# Patient Record
Sex: Male | Born: 1960 | Race: White | Hispanic: No | Marital: Married | Smoking: Never smoker
Health system: Southern US, Community
[De-identification: ages and names within clinical notes are randomized; demographics above are authoritative.]

## PROBLEM LIST (undated history)

## (undated) DIAGNOSIS — K5792 Diverticulitis of intestine, part unspecified, without perforation or abscess without bleeding: Secondary | ICD-10-CM

## (undated) HISTORY — PX: TONSILLECTOMY: SUR1361

---

## 2006-04-22 ENCOUNTER — Inpatient Hospital Stay (HOSPITAL_COMMUNITY): Admission: EM | Admit: 2006-04-22 | Discharge: 2006-04-29 | Payer: Self-pay | Admitting: Emergency Medicine

## 2006-05-05 ENCOUNTER — Encounter: Admission: RE | Admit: 2006-05-05 | Discharge: 2006-05-05 | Payer: Self-pay | Admitting: Interventional Radiology

## 2007-05-24 ENCOUNTER — Emergency Department (HOSPITAL_COMMUNITY): Admission: EM | Admit: 2007-05-24 | Discharge: 2007-05-24 | Payer: Self-pay | Admitting: Emergency Medicine

## 2008-07-07 ENCOUNTER — Emergency Department (HOSPITAL_COMMUNITY): Admission: EM | Admit: 2008-07-07 | Discharge: 2008-07-07 | Payer: Self-pay | Admitting: Emergency Medicine

## 2010-03-08 ENCOUNTER — Encounter: Payer: Self-pay | Admitting: Surgery

## 2010-07-03 NOTE — H&P (Signed)
NAME:  Connor Sanders, Connor Sanders NO.:  [id number]   MEDICAL RECORD NO.:  [id number]          PATIENT TYPE:  INP   LOCATION:  5702                         FACILITY:  [facility]   PHYSICIAN:  Cherylynn Ridges, M.D.    DATE OF BIRTH:  1960/06/16   DATE OF ADMISSION:  04/22/2006  DATE OF DISCHARGE:                              HISTORY & PHYSICAL   CHIEF COMPLAINT:  The patient is a 50 year old with acute diverticulitis  and diverticular abscess who comes in for admission and possible  surgery.   HISTORY OF PRESENT ILLNESS:  The patient has been sick since Saturday a  week ago.  He started out with some lower abdominal pain.  This subsided  by the following Tuesday to where he had completely no pain.  He did not  seek medical attention at that time.  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  His pain was worse and they sent home for  it because he looked very sick.   He came to emergency room about 3 o'clock this afternoon and had a CT  scan done subsequently which demonstrated acute diverticulitis with some  focal areas of free air, some free fluid, small bowel loops that are  dilated and a peridiverticular abscess.  Surgical consultation was  obtained.   His past medical history is unremarkable.  He has no renal, pulmonary or  other disease.  No liver disease.  He has no hypertension, no cardiac  disease.  He is a nonsmoker, nondrinker.  He has no primary MD.   REVIEW OF SYSTEMS:  He has had some normal bowel movements but none in  the last 24 hours.  His appetite has been good.  Last ate before coming  into the emergency room.   EXAMINATION:  He initially had a fever of 102.6.  He is 98.3 currently  and feels much better.  He has received 2 mg of IV morphine.  When he  came in, his pain was an 8 out of 10, that was about 7 p.m.  Currently,  at about 10:30 p.m., his pain is 0 out of  10 and he has not received any  morphine for 2-3 hours.  He is normocephalic, atraumatic and anicteric.  His neck is supple.  He has no cervical adenopathy.  He has no carotid  bruits.  His lungs are clear to auscultation.  His cardiac exam regular  rhythm and rate with no murmurs, gallops, lifts or heaves.  His abdomen  is distended.  He has a quiet abdomen.  No diffuse peritonitis.  He has  calf tenderness in the left side and the left lower quadrant but none on  the right side.  He is soft.  I cannot palpate any masses but he is  tender to palpation on the left side.  Rectal exam was not performed.  Cranial nerves II-XII are grossly intact.  Deep tendon reflexes are  symmetrical bilaterally.  Pulses got good femoral and dorsalis pedis and  pedal  pulses bilaterally.   LABORATORY STUDIES:  He has a white blood cell count of 12.3 thousand.  His hemoglobin was 13.5, hematocrit of 38.7.  Platelet count of 280.  UA  is unremarkable.  Electrolytes:  He has a potassium of 3.3, BUN 13,  creatinine is 1.0, albumin 3.3.  His SGOT and SGPT all appear to be  within normal limits.  Lipase is normal.  I have reviewed the patient's  CAT scan which does show some fluid diffusely in the abdomen and a few  bubbles in the right upper quadrant and around the peridiverticular  abscess which is very prominent on the CT scan.  I have asked the doctor  in radiology to see if this abscess were amenable to percutaneous  drainage and they would get back with me about doing so.  Otherwise  right now, he seems to be perfectly stable and comfortable.  I cannot  feed him, would not attempt to feed him, make him n.p.o., start him on  IV Primaxin and work to possibly get a percutaneous drain done tomorrow.  If this does not work out, I have explained to both he and his wife that  if he requires emergency  surgery then he will get a colectomy and colostomy with possible  takedown in the future.  The patient and his  wife understand this, along  with the friend who is in the room, and I will go ahead and admit the  patient to the [place] surgery service.      Cherylynn Ridges, M.D.  Electronically Signed     JOW/MEDQ  D:  04/22/2006  T:  04/23/2006  Job:  [id number]

## 2010-07-03 NOTE — Discharge Summary (Signed)
NAME:  Connor Sanders, Connor Sanders            ACCOUNT NO.:  [id number]   MEDICAL RECORD NO.:  [id number]          PATIENT TYPE:  INP   LOCATION:  5715                         FACILITY:  [facility]   PHYSICIAN:  Thornton Park. Daphine Deutscher, MD  DATE OF BIRTH:  1960-07-01   DATE OF ADMISSION:  04/22/2006  DATE OF DISCHARGE:  04/30/2006                               DISCHARGE SUMMARY   CHIEF COMPLAINT AND REASON FOR ADMISSION:  [name] is a 50 year old  male patient with no prior history of diverticular disease and no other  chronic high medical problems.  The patient had been having of abdominal  symptoms for 1 week.  Initially started as lower abdominal pain, waxed  and waned but became more severe by the date of admission.  He presented  to the ER in the late afternoon of April 22, 2006.  A CT scan revealed  acute diverticulitis with focal areas of free air, some free fluid and  small bowel loops that are dilated with a peridiverticular abscess.  Surgical consultation was obtained with Dr. Lindie Spruce.  On initial exam the  patient was febrile with a temperature of 102.6.  Pain  was better after  receiving morphine.  His abdomen had no bowel sounds.  He was tender  without evidence of peritonitis.  His white cell count was 12,300,  hemoglobin 13.5, platelet count 280.000, potassium slightly low at 3.3  and lipase was normal.  Dr. Lindie Spruce did review the patient's CT scan and  felt that the patient did have a peridiverticular abscess and has asked  interventional radiology to evaluate for percutaneous drainage.  Subsequently the patient has been admitted with the diagnosis of acute  diverticulitis with diverticular abscess.   HOSPITAL COURSE:  The patient was admitted to the general floor where he  was started on n.p.o. status, IV fluid hydration and IV Primaxin for  empiric treatment of diverticular abscess.  He was evaluated by  interventional radiology on April 23, 2006  and underwent a percutaneous  CT guided  drainage of diverticular abscess.  Purulent material was  obtained and was sent for culture.  This culture subsequently returned  back positive for pansensitive at E-coli.   The patient's pain was initially treated with IV narcotics and his bowel  sounds returned.  Abdomen softened up and became less tender.  Therefore, by April 24, 2006, he was started on clear liquids.  Diet was  advanced.  Teaching was given regarding low residue diet.  By April 25, 2006, the patient's white count was 9200.  He was having minimal lower  abdominal pain.  Repeat CT was performed on April 27, 2006.  This  revealed two new fluid collections inferior in the pelvis.  The patient  was subsequently taken on April 28, 2006 for a transgluteal percutaneous  drain per interventional radiology and 10 mL of serous fluid was  obtained and has been sent for culture.  This is pending at the time of  discharge.   By April 28, 2006, the patient had well controlled pain using oral pain  medications.  He had no fevers and was  tolerating a low residue diet  without abdominal pain.  He had minimal drainage from both of the  percutaneous drains and was otherwise deemed appropriate for discharge  home.   FINAL DISCHARGE DIAGNOSIS:  1. Acute diverticulitis.  2. Peridiverticular abscess.  3. Status post percutaneous drainage of pelvic abscesses x2.  First      abscess culture positive for pansensitive E-coli.   DISCHARGE MEDICATIONS:  1. Augmentin XR 875 mg b.i.d.  2. Since Vicodin 5/325 one or two tablets every 4 hours as needed for      pain.  3. Over-the-counter ibuprofen as needed for pain.   DISPOSITION:  Return to work.  This will be determined by Dr. Daphine Deutscher  after he sees him in follow-up.   ACTIVITY:  Increase activity slowly.  May walk up steps.  May shower.  No lifting greater than 15 pounds in the next two weeks.  No driving  while taking Vicodin.   FOLLOW-UP APPOINTMENTS:  The patient has an appointment  to see Dr.  Daphine Deutscher on Thursday April 3  at 12 p.m..  I have asked the hospital staff  to schedule the patient for an outpatient CT scan 1 week from discharge  prior to the April 3 appointment.  This actual appointment is pending  scheduling at time of dictation.   OTHER INSTRUCTIONS:  1. The patient has been instructed to bring his insurance card with      him to the physician visit.  2. He is to call the surgeon's office if a fever greater than to equal      to 101 degrees Fahrenheit, nausea, vomiting or diarrhea, new or      increased belly pain, drainage around the catheters.  3. The patient will be [facility] an [facility] with [facility].  They will manage flushing and other issues, i.e. dressing      changes to the percutaneous drain sites.  April 29, 2006      Revonda Standard L. Gwyneth Sprout Daphine Deutscher, MD  Electronically Signed    ALE/MEDQ  D:  04/29/2006  T:  04/30/2006  Job:  [id number]   cc:   Cherylynn Ridges, M.D.

## 2017-10-11 ENCOUNTER — Inpatient Hospital Stay (HOSPITAL_COMMUNITY)
Admission: EM | Admit: 2017-10-11 | Discharge: 2017-10-12 | DRG: 200 | Disposition: A | Discharge status: Home / Self Care | Payer: BLUE CROSS/BLUE SHIELD | Attending: General Surgery | Admitting: General Surgery

## 2017-10-11 ENCOUNTER — Encounter (HOSPITAL_COMMUNITY): Payer: Self-pay | Admitting: *Deleted

## 2017-10-11 ENCOUNTER — Other Ambulatory Visit: Payer: Self-pay

## 2017-10-11 ENCOUNTER — Emergency Department (HOSPITAL_COMMUNITY): Payer: BLUE CROSS/BLUE SHIELD

## 2017-10-11 DIAGNOSIS — S270XXA Traumatic pneumothorax, initial encounter: Secondary | ICD-10-CM | POA: Diagnosis not present

## 2017-10-11 DIAGNOSIS — S2241XA Multiple fractures of ribs, right side, initial encounter for closed fracture: Secondary | ICD-10-CM

## 2017-10-11 DIAGNOSIS — W132XXA Fall from, out of or through roof, initial encounter: Secondary | ICD-10-CM | POA: Diagnosis present

## 2017-10-11 DIAGNOSIS — S2249XA Multiple fractures of ribs, unspecified side, initial encounter for closed fracture: Secondary | ICD-10-CM | POA: Diagnosis present

## 2017-10-11 DIAGNOSIS — S22009A Unspecified fracture of unspecified thoracic vertebra, initial encounter for closed fracture: Secondary | ICD-10-CM

## 2017-10-11 DIAGNOSIS — S22069A Unspecified fracture of T7-T8 vertebra, initial encounter for closed fracture: Secondary | ICD-10-CM | POA: Diagnosis present

## 2017-10-11 HISTORY — DX: Diverticulitis of intestine, part unspecified, without perforation or abscess without bleeding: K57.92

## 2017-10-11 LAB — BASIC METABOLIC PANEL
Anion gap: 8 (ref 5–15)
BUN: 18 mg/dL (ref 6–20)
CO2: 24 mmol/L (ref 22–32)
Calcium: 9.2 mg/dL (ref 8.9–10.3)
Chloride: 105 mmol/L (ref 98–111)
Creatinine, Ser: 1.09 mg/dL (ref 0.61–1.24)
GFR calc Af Amer: >60 mL/min (ref >60)
GFR calc non Af Amer: >60 mL/min (ref >60)
Glucose, Bld: 130 mg/dL — ABNORMAL HIGH (ref 70–99)
Potassium: 4 mmol/L (ref 3.5–5.1)
Sodium: 137 mmol/L (ref 135–145)

## 2017-10-11 LAB — CBC WITH DIFFERENTIAL/PLATELET
Basophils Absolute: 0 10*3/uL (ref 0.0–0.1)
Basophils Relative: 0 %
Eosinophils Absolute: 0 10*3/uL (ref 0.0–0.7)
Eosinophils Relative: 0 %
HCT: 38.6 % — ABNORMAL LOW (ref 39.0–52.0)
Hemoglobin: 13.5 g/dL (ref 13.0–17.0)
Lymphocytes Relative: 6 %
Lymphs Abs: 1.2 10*3/uL (ref 0.7–4.0)
MCH: 30.3 pg (ref 26.0–34.0)
MCHC: 35 g/dL (ref 30.0–36.0)
MCV: 86.7 fL (ref 78.0–100.0)
Monocytes Absolute: 1.2 10*3/uL — ABNORMAL HIGH (ref 0.1–1.0)
Monocytes Relative: 6 %
Neutro Abs: 17.5 10*3/uL — ABNORMAL HIGH (ref 1.7–7.7)
Neutrophils Relative %: 88 %
Platelets: 224 10*3/uL (ref 150–400)
RBC: 4.45 MIL/uL (ref 4.22–5.81)
RDW: 12.9 % (ref 11.5–15.5)
WBC: 19.9 10*3/uL — ABNORMAL HIGH (ref 4.0–10.5)

## 2017-10-11 MED ORDER — HYDROMORPHONE HCL 1 MG/ML IJ SOLN
0.5000 mg | Freq: Once | INTRAMUSCULAR | Status: AC
  Administered 2017-10-11: 0.5 mg via INTRAVENOUS
  Filled 2017-10-11: qty 1

## 2017-10-11 MED ORDER — LORAZEPAM 2 MG/ML IJ SOLN
1.0000 mg | Freq: Once | INTRAMUSCULAR | Status: AC
  Administered 2017-10-11: 1 mg via INTRAVENOUS
  Filled 2017-10-11: qty 1

## 2017-10-11 MED ORDER — IOHEXOL 300 MG/ML  SOLN
75.0000 mL | Freq: Once | INTRAMUSCULAR | Status: AC | PRN
  Administered 2017-10-11: 75 mL via INTRAVENOUS

## 2017-10-11 MED ORDER — OXYCODONE-ACETAMINOPHEN 5-325 MG PO TABS
2.0000 | ORAL_TABLET | Freq: Once | ORAL | Status: AC
  Administered 2017-10-11: 2 via ORAL
  Filled 2017-10-11: qty 2

## 2017-10-11 MED ORDER — SODIUM CHLORIDE 0.9 % IV SOLN
INTRAVENOUS | Status: DC
  Administered 2017-10-11: 22:00:00 via INTRAVENOUS

## 2017-10-11 MED ORDER — KETOROLAC TROMETHAMINE 30 MG/ML IJ SOLN
15.0000 mg | Freq: Once | INTRAMUSCULAR | Status: AC
  Administered 2017-10-11: 15 mg via INTRAVENOUS
  Filled 2017-10-11: qty 1

## 2017-10-11 NOTE — ED Triage Notes (Signed)
Pt with fall from roof about 7 feet, landed on a deck metal chair on deck to his back. Pt to right upper abdomen, hard to breathe per pt.

## 2017-10-11 NOTE — ED Provider Notes (Signed)
[facility] EMERGENCY DEPARTMENT Provider Note   CSN: [id number] Arrival date & time: 10/11/17  2006     History   Chief Complaint Chief Complaint  Patient presents with  . Fall    HPI Raelene BottRaymond A Daughdrill is a 57 y.o. male.  HPI   57 year old male with right back and chest pain.  Mechanical fall shortly before arrival.  He estimates he fell approximately 7 feet from a roof.  Landed on some patio furniture.  He has had severe pain since then.  Did not strike his head.  No loss of consciousness.  Denies any headache or neck pain.  Pain is significantly worse with movement and breathing.  Denies abdominal pain.  No nausea vomiting.  No blood thinners.  History reviewed. No pertinent past medical history.  There are no active problems to display for this patient.   History reviewed. No pertinent surgical history.      Home Medications    Prior to Admission medications   Not on File    Family History History reviewed. No pertinent family history.  Social History Social History   Tobacco Use  . Smoking status: Never Smoker  . Smokeless tobacco: Never Used  Substance Use Topics  . Alcohol use: Never    Frequency: Never  . Drug use: Never     Allergies   Patient has no known allergies.   Review of Systems Review of Systems  All systems reviewed and negative, other than as noted in HPI.  Physical Exam Updated Vital Signs BP 114/77   Pulse 62   Temp (!) 97.5 F (36.4 C) (Oral)   Resp 14   Ht 5\' 11"  (1.803 m)   Wt 98.4 kg   SpO2 94%   BMI 30.27 kg/m   Physical Exam  Constitutional: He is oriented to person, place, and time. He appears well-developed and well-nourished.  Sitting in chair.  Appears extremely uncomfortable.  HENT:  Head: Normocephalic and atraumatic.  Eyes: Conjunctivae are normal. Right eye exhibits no discharge. Left eye exhibits no discharge.  Neck: Neck supple.  Cardiovascular: Normal rate, regular rhythm and normal heart  sounds. Exam reveals no gallop and no friction rub.  No murmur heard. Pulmonary/Chest: No respiratory distress.  Splinting.  Shallow respirations.  Breath sounds seem symmetric though.  Severe tenderness in the right lateral and posterior chest wall.  Mild swelling of the right mid thoracic back.  Abdominal: Soft. He exhibits no distension. There is no tenderness.  Musculoskeletal: He exhibits no edema or tenderness.  Neurological: He is alert and oriented to person, place, and time. No cranial nerve deficit. He exhibits normal muscle tone. Coordination normal.  Skin: Skin is warm and dry.  Psychiatric: He has a normal mood and affect. His behavior is normal. Thought content normal.  Nursing note and vitals reviewed.    ED Treatments / Results  Labs (all labs ordered are listed, but only abnormal results are displayed) Labs Reviewed  CBC WITH DIFFERENTIAL/PLATELET - Abnormal; Notable for the following components:      Result Value   WBC 19.9 (*)    HCT 38.6 (*)    Neutro Abs 17.5 (*)    Monocytes Absolute 1.2 (*)    All other components within normal limits  BASIC METABOLIC PANEL - Abnormal; Notable for the following components:   Glucose, Bld 130 (*)    All other components within normal limits    EKG None  Radiology Dg Ribs Unilateral W/chest Right  Result  Date: 10/11/2017 CLINICAL DATA:  Shortness of breath and right-sided rib pain following fall from roof, initial encounter EXAM: RIGHT RIBS AND CHEST - 3+ VIEW COMPARISON:  None. FINDINGS: Cardiac shadow is within normal limits. The overall inspiratory effort is poor with right basilar atelectasis. No pneumothorax is seen. Multiple right rib fractures are noted to include the sixth through ninth ribs posterolaterally. Some subcutaneous air is noted. IMPRESSION: Fractures of the right sixth through ninth ribs posterolaterally with associated basilar atelectasis. No definitive pneumothorax is seen. Electronically Signed   By: Alcide Clever M.D.   On: 10/11/2017 21:43   Ct Chest W Contrast  Result Date: 10/11/2017 CLINICAL DATA:  57 y/o M; fall from 7 foot roof. Blunt trauma with injury to the right upper abdomen. EXAM: CT CHEST WITH CONTRAST TECHNIQUE: Multidetector CT imaging of the chest was performed during intravenous contrast administration. CONTRAST:  75mL OMNIPAQUE IOHEXOL 300 MG/ML  SOLN COMPARISON:  None. FINDINGS: Cardiovascular: No significant vascular findings. Normal heart size. No pericardial effusion. Severe coronary artery calcific atherosclerosis. Mediastinum/Nodes: No enlarged mediastinal, hilar, or axillary lymph nodes. Thyroid gland, trachea, and esophagus demonstrate no significant findings. Lungs/Pleura: Edema within the right posterior chest wall as well as pneumatosis for multiple rib fractures. Small right pneumothorax. Upper Abdomen: Cholelithiasis. Musculoskeletal: Mildly displaced right lateral 6 rib fracture. Right rib 7 neck minimally displaced fracture and fracture of the adjacent T7 right transverse process. Additional rib 7 right posterolateral 2 shaft's width displaced and comminuted fracture. Right rib 8 minimally displaced posterolateral fracture. Right rib 9 minimally displaced rib head and posterolateral fractures. IMPRESSION: 1. Small right pneumothorax.  No mediastinal shift. 2. Right ribs 6-9 posterior fractures. Ribs 7 and 9 have multiple fractures and there is 2 shaft's width displacement of the right posterolateral rib 7 fracture. 3. Right T7 transverse process minimally displaced fracture. 4. Edema and pneumatosis within the right posterior chest wall. 5. Cholelithiasis. 6. Severe coronary artery calcific atherosclerosis. These results were called by telephone at the time of interpretation on 10/11/2017 at 11:14 pm to Dr. Raeford Razor , who verbally acknowledged these results. Electronically Signed   By: Mitzi Hansen M.D.   On: 10/11/2017 23:21    Procedures Procedures (including  critical care time)  Medications Ordered in ED Medications  0.9 %  sodium chloride infusion ( Intravenous New Bag/Given 10/11/17 2220)  oxyCODONE-acetaminophen (PERCOCET/ROXICET) 5-325 MG per tablet 2 tablet (2 tablets Oral Given 10/11/17 2032)  ketorolac (TORADOL) 30 MG/ML injection 15 mg (15 mg Intravenous Given 10/11/17 2220)  LORazepam (ATIVAN) injection 1 mg (1 mg Intravenous Given 10/11/17 2220)  HYDROmorphone (DILAUDID) injection 0.5 mg (0.5 mg Intravenous Given 10/11/17 2220)  iohexol (OMNIPAQUE) 300 MG/ML solution 75 mL (75 mLs Intravenous Contrast Given 10/11/17 2256)     Initial Impression / Assessment and Plan / ED Course  I have reviewed the triage vital signs and the nursing notes.  Pertinent labs & imaging results that were available during my care of the patient were reviewed by me and considered in my medical decision making (see chart for details).  I have reviewed the triage vital signs and the nursing notes. Prior records were reviewed for additional information.    Pertinent labs & imaging results that were available during my care of the patient were reviewed by me and considered in my medical decision making (see chart for details).   Mechanical fall.  Multiple rib fractures with 7 and 9 broken in multiple locations.  Right T7 transverse process fracture.  Occult pneumothorax.  Discussed with Dr Magnus Ivan, trauma. Transfer to [facility] ED. Discussed with Dr Judd Lien, EDP.   Final Clinical Impressions(s) / ED Diagnoses   Final diagnoses:  Closed fracture of multiple ribs of right side, initial encounter  Closed fracture of transverse process of thoracic vertebra, initial encounter [facility])  Traumatic pneumothorax, initial encounter    ED Discharge Orders    None       Raeford Razor, MD 10/12/17 [phone]

## 2017-10-12 ENCOUNTER — Inpatient Hospital Stay (HOSPITAL_COMMUNITY): Payer: BLUE CROSS/BLUE SHIELD

## 2017-10-12 ENCOUNTER — Encounter (HOSPITAL_COMMUNITY): Payer: Self-pay | Admitting: General Practice

## 2017-10-12 DIAGNOSIS — S22069A Unspecified fracture of T7-T8 vertebra, initial encounter for closed fracture: Secondary | ICD-10-CM | POA: Diagnosis not present

## 2017-10-12 DIAGNOSIS — S270XXA Traumatic pneumothorax, initial encounter: Secondary | ICD-10-CM | POA: Diagnosis present

## 2017-10-12 DIAGNOSIS — S2249XA Multiple fractures of ribs, unspecified side, initial encounter for closed fracture: Secondary | ICD-10-CM | POA: Diagnosis present

## 2017-10-12 DIAGNOSIS — W132XXA Fall from, out of or through roof, initial encounter: Secondary | ICD-10-CM | POA: Diagnosis not present

## 2017-10-12 DIAGNOSIS — S2241XA Multiple fractures of ribs, right side, initial encounter for closed fracture: Secondary | ICD-10-CM | POA: Diagnosis not present

## 2017-10-12 LAB — BASIC METABOLIC PANEL
Anion gap: 8 (ref 5–15)
BUN: 17 mg/dL (ref 6–20)
CALCIUM: 8.9 mg/dL (ref 8.9–10.3)
CO2: 23 mmol/L (ref 22–32)
Chloride: 105 mmol/L (ref 98–111)
Creatinine, Ser: 0.97 mg/dL (ref 0.61–1.24)
GFR calc Af Amer: >60 mL/min (ref >60)
GLUCOSE: 127 mg/dL — AB (ref 70–99)
Potassium: 4.2 mmol/L (ref 3.5–5.1)
Sodium: 136 mmol/L (ref 135–145)

## 2017-10-12 LAB — CBC
HCT: 37.7 % — ABNORMAL LOW (ref 39.0–52.0)
Hemoglobin: 12.4 g/dL — ABNORMAL LOW (ref 13.0–17.0)
MCH: 30 pg (ref 26.0–34.0)
MCHC: 32.9 g/dL (ref 30.0–36.0)
MCV: 91.1 fL (ref 78.0–100.0)
PLATELETS: 207 10*3/uL (ref 150–400)
RBC: 4.14 MIL/uL — ABNORMAL LOW (ref 4.22–5.81)
RDW: 12.7 % (ref 11.5–15.5)
WBC: 9.4 10*3/uL (ref 4.0–10.5)

## 2017-10-12 LAB — HIV ANTIBODY (ROUTINE TESTING W REFLEX): HIV Screen 4th Generation wRfx: NONREACTIVE

## 2017-10-12 MED ORDER — DOCUSATE SODIUM 100 MG PO CAPS
100.0000 mg | ORAL_CAPSULE | Freq: Every day | ORAL | Status: DC
  Administered 2017-10-12: 100 mg via ORAL
  Filled 2017-10-12: qty 1

## 2017-10-12 MED ORDER — HYDROMORPHONE HCL 1 MG/ML IJ SOLN: 1.0000 mg | INTRAMUSCULAR | Status: DC | PRN

## 2017-10-12 MED ORDER — DIPHENHYDRAMINE HCL 50 MG/ML IJ SOLN: 12.5000 mg | Freq: Four times a day (QID) | INTRAMUSCULAR | Status: DC | PRN

## 2017-10-12 MED ORDER — ACETAMINOPHEN 500 MG PO TABS: 1000.0000 mg | ORAL_TABLET | Freq: Three times a day (TID) | ORAL | 0 refills | Status: AC | PRN

## 2017-10-12 MED ORDER — OXYCODONE HCL 5 MG PO TABS: 10.0000 mg | ORAL_TABLET | ORAL | Status: DC | PRN

## 2017-10-12 MED ORDER — ONDANSETRON HCL 4 MG/2ML IJ SOLN: 4.0000 mg | Freq: Four times a day (QID) | INTRAMUSCULAR | Status: DC | PRN

## 2017-10-12 MED ORDER — NALOXONE HCL 0.4 MG/ML IJ SOLN: 0.4000 mg | INTRAMUSCULAR | Status: DC | PRN

## 2017-10-12 MED ORDER — POTASSIUM CHLORIDE IN NACL 20-0.9 MEQ/L-% IV SOLN
INTRAVENOUS | Status: DC
  Administered 2017-10-12: 06:00:00 via INTRAVENOUS
  Filled 2017-10-12: qty 1000

## 2017-10-12 MED ORDER — ACETAMINOPHEN 500 MG PO TABS: 1000.0000 mg | ORAL_TABLET | Freq: Three times a day (TID) | ORAL | Status: DC

## 2017-10-12 MED ORDER — OXYCODONE HCL 5 MG PO TABS: 5.0000 mg | ORAL_TABLET | ORAL | Status: DC | PRN

## 2017-10-12 MED ORDER — POLYETHYLENE GLYCOL 3350 17 G PO PACK
17.0000 g | PACK | Freq: Every day | ORAL | Status: DC
  Administered 2017-10-12: 17 g via ORAL
  Filled 2017-10-12: qty 1

## 2017-10-12 MED ORDER — DIPHENHYDRAMINE HCL 12.5 MG/5ML PO ELIX: 12.5000 mg | ORAL_SOLUTION | Freq: Four times a day (QID) | ORAL | Status: DC | PRN

## 2017-10-12 MED ORDER — ENOXAPARIN SODIUM 40 MG/0.4ML ~~LOC~~ SOLN
40.0000 mg | SUBCUTANEOUS | Status: DC
  Filled 2017-10-12: qty 0.4

## 2017-10-12 MED ORDER — MORPHINE SULFATE 2 MG/ML IV SOLN
INTRAVENOUS | Status: DC
  Administered 2017-10-12: 08:00:00 via INTRAVENOUS
  Filled 2017-10-12: qty 30

## 2017-10-12 MED ORDER — ACETAMINOPHEN 325 MG PO TABS
650.0000 mg | ORAL_TABLET | Freq: Four times a day (QID) | ORAL | Status: DC
  Administered 2017-10-12: 650 mg via ORAL
  Filled 2017-10-12: qty 2

## 2017-10-12 MED ORDER — TRAMADOL HCL 50 MG PO TABS: 50.0000 mg | ORAL_TABLET | Freq: Four times a day (QID) | ORAL | Status: DC | PRN

## 2017-10-12 MED ORDER — TRAMADOL HCL 50 MG PO TABS
50.0000 mg | ORAL_TABLET | Freq: Four times a day (QID) | ORAL | Status: DC
  Administered 2017-10-12 (×2): 50 mg via ORAL
  Filled 2017-10-12 (×2): qty 1

## 2017-10-12 MED ORDER — ONDANSETRON 4 MG PO TBDP: 4.0000 mg | ORAL_TABLET | Freq: Four times a day (QID) | ORAL | Status: DC | PRN

## 2017-10-12 MED ORDER — IBUPROFEN 800 MG PO TABS: 400.0000 mg | ORAL_TABLET | Freq: Three times a day (TID) | ORAL | 0 refills | Status: AC | PRN

## 2017-10-12 MED ORDER — KETOROLAC TROMETHAMINE 30 MG/ML IJ SOLN
30.0000 mg | Freq: Three times a day (TID) | INTRAMUSCULAR | Status: DC
  Administered 2017-10-12 (×2): 30 mg via INTRAVENOUS
  Filled 2017-10-12 (×2): qty 1

## 2017-10-12 MED ORDER — SODIUM CHLORIDE 0.9% FLUSH: 9.0000 mL | INTRAVENOUS | Status: DC | PRN

## 2017-10-12 MED ORDER — POLYETHYLENE GLYCOL 3350 17 G PO PACK: 17.0000 g | PACK | Freq: Every day | ORAL | 0 refills | Status: AC | PRN

## 2017-10-12 MED ORDER — TRAMADOL HCL 50 MG PO TABS: 50.0000 mg | ORAL_TABLET | Freq: Four times a day (QID) | ORAL | 0 refills | Status: AC | PRN

## 2017-10-12 NOTE — Progress Notes (Signed)
Allen KellNadia Harvey, RN witnessed waste of 21 mg of morphine PCA

## 2017-10-12 NOTE — Evaluation (Signed)
Physical Therapy Evaluation & Discharge Patient Details Name: Connor Sanders MRN: [id number] DOB: 09/21/1960 Today's Date: 10/12/2017   History of Present Illness  Patient is a 57 y/o male status post fall from 7' roof with multiple rib fractures and very small right pneumothorax &  CT scan did show a C7 transverse process fracture. .  Clinical Impression  Patient presents close to functional baseline with mild limitations due to rib pain on L.  Reports L arm was not as mobile yesterday, but noted able to hold antigravity.  Feel should progress at home without follow up PT and safe for d/c to home with family.  No further skilled PT needs at this time.     Follow Up Recommendations No PT follow up;Supervision - Intermittent    Equipment Recommendations  None recommended by PT    Recommendations for Other Services       Precautions / Restrictions Precautions Precautions: None      Mobility  Bed Mobility Overal bed mobility: Modified Independent                Transfers Overall transfer level: Independent Equipment used: None                Ambulation/Gait Ambulation/Gait assistance: Independent Gait Distance (Feet): 250 Feet Assistive device: None Gait Pattern/deviations: Step-through pattern;WFL(Within Functional Limits)     General Gait Details: SpO2 on RA with ambulation 93%  Stairs            Wheelchair Mobility    Modified Rankin (Stroke Patients Only)       Balance Overall balance assessment: No apparent balance deficits (not formally assessed)                                           Pertinent Vitals/Pain Pain Assessment: 0-10 Pain Score: 3  Pain Location: right ribs Pain Descriptors / Indicators: Sore Pain Intervention(s): Monitored during session;Repositioned    Home Living Family/patient expects to be discharged to:: Private residence Living Arrangements: Spouse/significant other Available Help at  Discharge: Family Type of Home: House Home Access: Stairs to enter Entrance Stairs-Rails: None Secretary/administrator of Steps: 2 Home Layout: One level Home Equipment: Cane - single point      Prior Function Level of Independence: Independent               Hand Dominance        Extremity/Trunk Assessment   Upper Extremity Assessment Upper Extremity Assessment: RUE deficits/detail RUE Deficits / Details: AROM WFL, pain with shoulder elevation, but negative drop arm    Lower Extremity Assessment Lower Extremity Assessment: Overall WFL for tasks assessed       Communication   Communication: No difficulties  Cognition Arousal/Alertness: Awake/alert Behavior During Therapy: WFL for tasks assessed/performed Overall Cognitive Status: Within Functional Limits for tasks assessed                                        General Comments      Exercises     Assessment/Plan    PT Assessment Patent does not need any further PT services  PT Problem List         PT Treatment Interventions      PT Goals (Current goals can be found in the Care  Plan section)  Acute Rehab PT Goals PT Goal Formulation: All assessment and education complete, DC therapy    Frequency     Barriers to discharge        Co-evaluation               AM-PAC PT "6 Clicks" Daily Activity  Outcome Measure Difficulty turning over in bed (including adjusting bedclothes, sheets and blankets)?: None Difficulty moving from lying on back to sitting on the side of the bed? : A Little Difficulty sitting down on and standing up from a chair with arms (e.g., wheelchair, bedside commode, etc,.)?: None Help needed moving to and from a bed to chair (including a wheelchair)?: None Help needed walking in hospital room?: None Help needed climbing 3-5 steps with a railing? : None 6 Click Score: 23    End of Session Equipment Utilized During Treatment: Gait belt Activity Tolerance:  Patient tolerated treatment well Patient left: in bed;with call bell/phone within reach   PT Visit Diagnosis: Other abnormalities of gait and mobility (R26.89)    Time: 1210-1222 PT Time Calculation (min) (ACUTE ONLY): 12 min   Charges:   PT Evaluation $PT Eval Low Complexity: 1 Low          [place] Hillsyndi Laken Rog, [place] [phone] 10/12/2017   Elray Mcgregorynthia Tin Engram 10/12/2017, 1:02 PM

## 2017-10-12 NOTE — Discharge Instructions (Signed)
1. PAIN CONTROL:  1. Pain is best controlled by a usual combination of three different methods TOGETHER:  1. Ice/Heat 2. Over the counter pain medication 3. Prescription pain medication 2. Most patients will experience some swelling and bruising around ribs. Ice packs or heating pads (30-60 minutes up to 6 times a day) will help. Use ice for the first few days to help decrease swelling and bruising, then switch to heat to help relax tight/sore spots and speed recovery. Some people prefer to use ice alone, heat alone, alternating between ice & heat. Experiment to what works for you. Swelling and bruising can take several weeks to resolve.  3. It is helpful to take an over-the-counter pain medication regularly for the first few weeks. Choose one of the following that works best for you:  1. Naproxen (Aleve, etc) Two 220mg  tabs twice a day 2. Ibuprofen (Advil, etc) Three 200mg  tabs four times a day (every meal & bedtime) 3. Acetaminophen (Tylenol, etc) 500-650mg  four times a day (every meal & bedtime) 4. A prescription for pain medication (such as oxycodone, hydrocodone, etc) should be given to you upon discharge. Take your pain medication as prescribed.  1. If you are having problems/concerns with the prescription medicine (does not control pain, nausea, vomiting, rash, itching, etc), please call us [phone] to see if we need to switch you to a different pain medicine that will work better for you and/or control your side effect better. 2. If you need a refill on your pain medication, please contact your pharmacy. They will contact our office to request authorization. Prescriptions will not be filled after 5 pm or on week-ends. 4. Avoid getting constipated. When taking pain medications, it is common to experience some constipation. Increasing fluid intake and taking a fiber supplement (such as Metamucil, Citrucel, FiberCon, MiraLax, etc) 1-2 times a day regularly will usually help prevent this  problem from occurring. A mild laxative (prune juice, Milk of Magnesia, MiraLax, etc) should be taken according to package directions if there are no bowel movements after 48 hours.  5. Watch out for diarrhea. If you have many loose bowel movements, simplify your diet to bland foods & liquids for a few days. Stop any stool softeners and decrease your fiber supplement. Switching to mild anti-diarrheal medications (Kayopectate, Pepto Bismol) can help. If this worsens or does not improve, please call us. 6. FOLLOW UP in our office  1. Please call [facility] at [phone] to set up an appointment for a follow-up appointment approximately 2-3 weeks after discharge   WHEN TO CALL US [phone]:  1. Poor pain control 2. Reactions / problems with new medications (rash/itching, nausea, etc)  3. Fever over 101.5 F (38.5 C) 4. Worsening swelling or bruising 5. Any concerning symptoms  The clinic staff is available to answer your questions during regular business hours (8:30am-5pm). Please dont hesitate to call and ask to speak to one of our nurses for clinical concerns.  If you have a medical emergency, go to the nearest emergency room or call 911.  A surgeon from [facility] is always on call at the [facility]   [facility] [place]  [address], [address], [place], [place] [address] ?  MAIN: [phone] ? TOLL FREE: [phone] ?  FAX [phone]  [email or web address]

## 2017-10-12 NOTE — ED Notes (Signed)
Paged Trauma (blackman) to Dr. Judd Lienelo

## 2017-10-12 NOTE — ED Notes (Signed)
Repaged Trauma (blackman); no response after previous pages

## 2017-10-12 NOTE — Progress Notes (Signed)
Connor Sanders [name] to be D/C'd  per MD order. Discussed with the patient and all questions fully answered.  VSS, Skin clean, dry and intact without evidence of skin break down, no evidence of skin tears noted.  IV catheter discontinued intact. Site without signs and symptoms of complications. Dressing and pressure applied.  An After Visit Summary was printed and given to the patient. Patient received prescription.  D/c education completed with patient/family including follow up instructions, medication list, d/c activities limitations if indicated, with other d/c instructions as indicated by MD - patient able to verbalize understanding, all questions fully answered.   Patient instructed to return to ED, call 911, or call MD for any changes in condition.   Patient to be escorted via WC, and D/C home via private auto.

## 2017-10-12 NOTE — Evaluation (Signed)
Occupational Therapy Evaluation Patient Details Name: Connor Sanders MRN: [id number] DOB: 09/21/1960 Today's Date: 10/12/2017    History of Present Illness Patient is a 57 y/o male status post fall from 7' roof with multiple rib fractures and very small right pneumothorax &  CT scan did show a C7 transverse process fracture. .   Clinical Impression   Patient evaluated by Occupational Therapy with no further acute OT needs identified. All education has been completed and the patient has no further questions. See below for any follow-up Occupational Therapy or equipment needs. OT to sign off. Thank you for referral.      Follow Up Recommendations  No OT follow up    Equipment Recommendations  None recommended by OT    Recommendations for Other Services       Precautions / Restrictions Precautions Precautions: None      Mobility Bed Mobility Overal bed mobility: Modified Independent                Transfers Overall transfer level: Independent Equipment used: None                  Balance Overall balance assessment: No apparent balance deficits (not formally assessed)                                         ADL either performed or assessed with clinical judgement   ADL Overall ADL's : Independent                                       General ADL Comments: able to cross figure 4 for LB . pt works as a [employer] and edcuated on avoiding bending to prevent rib and back pain      Vision Baseline Vision/History: No visual deficits       Perception     Praxis      Pertinent Vitals/Pain Pain Assessment: 0-10 Pain Score: 3  Pain Location: right ribs Pain Descriptors / Indicators: Sore Pain Intervention(s): Premedicated before session;Monitored during session     Hand Dominance Right   Extremity/Trunk Assessment Upper Extremity Assessment Upper Extremity Assessment: Overall WFL for tasks assessed RUE Deficits /  Details: AROM WFL, pain with shoulder elevation, but negative drop arm   Lower Extremity Assessment Lower Extremity Assessment: Defer to PT evaluation   Cervical / Trunk Assessment Cervical / Trunk Assessment: Normal;Other exceptions(back fx present and educated on avoiding bending)   Communication Communication Communication: No difficulties   Cognition Arousal/Alertness: Awake/alert Behavior During Therapy: WFL for tasks assessed/performed Overall Cognitive Status: Within Functional Limits for tasks assessed                                     General Comments       Exercises     Shoulder Instructions      Home Living Family/patient expects to be discharged to:: Private residence Living Arrangements: Spouse/significant other Available Help at Discharge: Family Type of Home: House Home Access: Stairs to enter Secretary/administratorntrance Stairs-Number of Steps: 2 Entrance Stairs-Rails: None Home Layout: One level     Bathroom Shower/Tub: Walk-in shower         Home Equipment: Gilmer MorCane - single point  Prior Functioning/Environment Level of Independence: Independent                 OT Problem List:        OT Treatment/Interventions:      OT Goals(Current goals can be found in the care plan section) Acute Rehab OT Goals Patient Stated Goal: to return to work on Tuesday  OT Frequency:     Barriers to D/C:            Co-evaluation              AM-PAC PT "6 Clicks" Daily Activity     Outcome Measure Help from another person eating meals?: None Help from another person taking care of personal grooming?: None Help from another person toileting, which includes using toliet, bedpan, or urinal?: None Help from another person bathing (including washing, rinsing, drying)?: None Help from another person to put on and taking off regular upper body clothing?: None Help from another person to put on and taking off regular lower body clothing?: None 6  Click Score: 24   End of Session Nurse Communication: Mobility status;Precautions  Activity Tolerance: Patient tolerated treatment well Patient left: in bed;with call bell/phone within reach                   Time: 1245-1300 OT Time Calculation (min): 15 min Charges:  OT General Charges $OT Visit: 1 Visit OT Evaluation $OT Eval Low Complexity: 1 Low   Connor Sanders   OTR/L Pager: [phone] Office: [phone] .   Connor Sanders 10/12/2017, 3:24 PM

## 2017-10-12 NOTE — ED Notes (Signed)
Pt arrives via [facility], received report from [place] EMT. Pt alert and oriented, pending trauma consult.

## 2017-10-12 NOTE — H&P (Signed)
History   Connor Sanders is an 57 y.o. male.   Chief Complaint:  Chief Complaint  Patient presents with  . Fall    Fall  Pertinent negatives include no abdominal pain or neck pain.  This gentleman arrived here this morning transferred from [facility].  He presented there yesterday after falling 7 feet off a roof.  He landed on his right side.  He was found on a work-up to have multiple right-sided rib fractures and a very small pneumothorax.  Currently, the patient reports moderate chest pain but no shortness of breath.  There was no loss of consciousness.  He currently denies headache, neck pain, or abdominal pain.  He is otherwise without complaints.  History reviewed. No pertinent past medical history.  History reviewed. No pertinent surgical history.  History reviewed. No pertinent family history. Social History:  reports that he has never smoked. He has never used smokeless tobacco. He reports that he does not drink alcohol or use drugs.  Allergies  No Known Allergies  Home Medications   No medications prior to admission.    Trauma Course   Results for orders placed or performed during the hospital encounter of 10/11/17 (from the past 48 hour(s))  CBC with Differential     Status: Abnormal   Collection Time: 10/11/17 10:21 PM  Result Value Ref Range   WBC 19.9 (H) 4.0 - 10.5 K/uL   RBC 4.45 4.22 - 5.81 MIL/uL   Hemoglobin 13.5 13.0 - 17.0 g/dL   HCT 38.6 (L) 39.0 - 52.0 %   MCV 86.7 78.0 - 100.0 fL   MCH 30.3 26.0 - 34.0 pg   MCHC 35.0 30.0 - 36.0 g/dL   RDW 12.9 11.5 - 15.5 %   Platelets 224 150 - 400 K/uL   Neutrophils Relative % 88 %   Neutro Abs 17.5 (H) 1.7 - 7.7 K/uL   Lymphocytes Relative 6 %   Lymphs Abs 1.2 0.7 - 4.0 K/uL   Monocytes Relative 6 %   Monocytes Absolute 1.2 (H) 0.1 - 1.0 K/uL   Eosinophils Relative 0 %   Eosinophils Absolute 0.0 0.0 - 0.7 K/uL   Basophils Relative 0 %   Basophils Absolute 0.0 0.0 - 0.1 K/uL    Comment:  Performed at [facility], [address], [place], [place] [address]  Basic metabolic panel     Status: Abnormal   Collection Time: 10/11/17 10:21 PM  Result Value Ref Range   Sodium 137 135 - 145 mmol/L   Potassium 4.0 3.5 - 5.1 mmol/L   Chloride 105 98 - 111 mmol/L   CO2 24 22 - 32 mmol/L   Glucose, Bld 130 (H) 70 - 99 mg/dL   BUN 18 6 - 20 mg/dL   Creatinine, Ser 1.09 0.61 - 1.24 mg/dL   Calcium 9.2 8.9 - 10.3 mg/dL   GFR calc non Af Amer >60 >60 mL/min   GFR calc Af Amer >60 >60 mL/min    Comment: (NOTE) The eGFR has been calculated using the CKD EPI equation. This calculation has not been validated in all clinical situations. eGFR's persistently <60 mL/min signify possible Chronic Kidney Disease.    Anion gap 8 5 - 15    Comment: Performed at [facility], [address] [place], [place] [address]   Dg Ribs Unilateral W/chest Right  Result Date: 10/11/2017 CLINICAL DATA:  Shortness of breath and right-sided rib pain following fall from roof, initial encounter EXAM: RIGHT RIBS AND CHEST -  3+ VIEW COMPARISON:  None. FINDINGS: Cardiac shadow is within normal limits. The overall inspiratory effort is poor with right basilar atelectasis. No pneumothorax is seen. Multiple right rib fractures are noted to include the sixth through ninth ribs posterolaterally. Some subcutaneous air is noted. IMPRESSION: Fractures of the right sixth through ninth ribs posterolaterally with associated basilar atelectasis. No definitive pneumothorax is seen. Electronically Signed   By: Inez Catalina M.D.   On: 10/11/2017 21:43   Ct Chest W Contrast  Result Date: 10/11/2017 CLINICAL DATA:  57 y/o M; fall from 7 foot roof. Blunt trauma with injury to the right upper abdomen. EXAM: CT CHEST WITH CONTRAST TECHNIQUE: Multidetector CT imaging of the chest was performed during intravenous contrast administration. CONTRAST:  50m OMNIPAQUE IOHEXOL 300 MG/ML  SOLN COMPARISON:  None. FINDINGS: Cardiovascular: No  significant vascular findings. Normal heart size. No pericardial effusion. Severe coronary artery calcific atherosclerosis. Mediastinum/Nodes: No enlarged mediastinal, hilar, or axillary lymph nodes. Thyroid gland, trachea, and esophagus demonstrate no significant findings. Lungs/Pleura: Edema within the right posterior chest wall as well as pneumatosis for multiple rib fractures. Small right pneumothorax. Upper Abdomen: Cholelithiasis. Musculoskeletal: Mildly displaced right lateral 6 rib fracture. Right rib 7 neck minimally displaced fracture and fracture of the adjacent T7 right transverse process. Additional rib 7 right posterolateral 2 shaft's width displaced and comminuted fracture. Right rib 8 minimally displaced posterolateral fracture. Right rib 9 minimally displaced rib head and posterolateral fractures. IMPRESSION: 1. Small right pneumothorax.  No mediastinal shift. 2. Right ribs 6-9 posterior fractures. Ribs 7 and 9 have multiple fractures and there is 2 shaft's width displacement of the right posterolateral rib 7 fracture. 3. Right T7 transverse process minimally displaced fracture. 4. Edema and pneumatosis within the right posterior chest wall. 5. Cholelithiasis. 6. Severe coronary artery calcific atherosclerosis. These results were called by telephone at the time of interpretation on 10/11/2017 at 11:14 pm to Dr. SVirgel Manifold, who verbally acknowledged these results. Electronically Signed   By: LKristine GarbeM.D.   On: 10/11/2017 23:21    Review of Systems  Respiratory: Negative for shortness of breath.   Gastrointestinal: Negative for abdominal pain.  Musculoskeletal: Negative for neck pain.  All other systems reviewed and are negative.   Blood pressure 105/69, pulse (!) 46, temperature 97.9 F (36.6 C), temperature source Oral, resp. rate 15, height '5\' 11"'  (1.803 m), weight 98.4 kg, SpO2 98 %. Physical Exam  Constitutional: He is oriented to person, place, and time. He  appears well-developed and well-nourished. No distress.  HENT:  Head: Normocephalic and atraumatic.  Right Ear: External ear normal.  Left Ear: External ear normal.  Nose: Nose normal.  Mouth/Throat: Oropharynx is clear and moist. No oropharyngeal exudate.  Eyes: Pupils are equal, round, and reactive to light. Right eye exhibits no discharge. Left eye exhibits no discharge. No scleral icterus.  Neck: Normal range of motion. Neck supple. No tracheal deviation present.  Cervical spine is nontender  Cardiovascular: Normal rate, regular rhythm, normal heart sounds and intact distal pulses.  No murmur heard. Respiratory: Effort normal and breath sounds normal. He exhibits tenderness.  There is right-sided chest wall tenderness with occasional right-sided rales  GI: Soft. Bowel sounds are normal. He exhibits no distension. There is no tenderness. There is no rebound.  Musculoskeletal: Normal range of motion. He exhibits no tenderness or deformity.  Neurological: He is alert and oriented to person, place, and time.  Skin: Skin is warm and dry. He is not diaphoretic.  No erythema.  Psychiatric: His behavior is normal. Judgment normal.     Assessment/Plan Patient status post fall with multiple rib fractures and very small right pneumothorax  I discussed the diagnosis with the patient.  Plan will be to admit the patient for pain control and pulmonary toilet.  His chest x-ray is currently being repeated to see if there is any expansion of the small pneumothorax.  The CT scan did show a C7 transverse process fracture.  Clinically he is nontender and has no pain on physical examination.  We will follow this and I will discuss this with the trauma team this morning as to whether or not this needs further work-up.  [place] Doig A 10/12/2017, 5:53 AM   Procedures

## 2017-10-12 NOTE — Social Work (Signed)
CSW met with pt at bedside, pt discharging home with his wife. Pt states he was trying to repair a hole in the roof when he lost his footing. Completed SBIRT, pt does not drink states he does not have any concerns or desire any resources.   CSW signing off. Please consult if any additional needs arise.  Alexander Mt, [place] [facility] [phone]

## 2017-10-12 NOTE — Progress Notes (Signed)
Patient arrived to unit from ED. Alert and oriented x4. Full movement of all extremities. Patient able to stand and walk to transfer from bed. No family at bedside. Patient states pain 6 out of 10.

## 2017-10-12 NOTE — ED Notes (Signed)
Re-paged Trauma Connor Sanders(Blackman)- no response after previous pages

## 2017-10-12 NOTE — Progress Notes (Signed)
[facility]/Trauma Progress Note      Assessment/Plan Hx of diverticulitis  Fall from a 7 foot roof Right ribs 6-9 posterior fractures Small R PTX - IS, repeat CXR this am showed persistent small PTX T7 TPF - pain control, PT/OT  FEN: reg diet VTE: SCD's, lovenox ID: none Foley: none Follow up: TBD  DISPO: PT/OT pending, repeat CXR am. Pain control. Mobilize, IS    LOS: 0 days    Subjective: CC: Posterior rib pain  Pt has not used PCA yet. He is not having much pain. He said he is pulling 1750cc on IS. No abdominal pain, new areas of pain, nausea, vomiting, fever, or chills. No numbness or tingling, or weakness. No family at bedside.   Objective: Vital signs in last 24 hours: Temp:  [97.5 F (36.4 C)-98 F (36.7 C)] 97.9 F (36.6 C) (08/28 0232) Pulse Rate:  [46-70] 46 (08/28 0500) Resp:  [11-19] 15 (08/28 0500) BP: (102-181)/(66-147) 105/69 (08/28 0500) SpO2:  [94 %-100 %] 98 % (08/28 0500) Weight:  [98.4 kg] 98.4 kg (08/28 0232) Last BM Date: 10/11/17  Intake/Output from previous day: No intake/output data recorded. Intake/Output this shift: No intake/output data recorded.  PE: Gen:  Alert, NAD, pleasant, cooperative Card:  RRR, no M/G/R heard Pulm:  CTA, no W/R/R, rate and effort normal Abd: Soft, NT/ND, +BS, no HSM Neuro: no motor or sensory deficits Extremities: moves all 4's Skin: no rashes noted, warm and dry   Anti-infectives: Anti-infectives (From admission, onward)   None      Lab Results:  Recent Labs    10/11/17 2221 10/12/17 0559  WBC 19.9* 9.4  HGB 13.5 12.4*  HCT 38.6* 37.7*  PLT 224 207   BMET Recent Labs    10/11/17 2221 10/12/17 0559  NA 137 136  K 4.0 4.2  CL 105 105  CO2 24 23  GLUCOSE 130* 127*  BUN 18 17  CREATININE 1.09 0.97  CALCIUM 9.2 8.9   PT/INR No results for input(s): LABPROT, INR in the last 72 hours. CMP     Component Value Date/Time   NA 136 10/12/2017 0559   K 4.2 10/12/2017  0559   CL 105 10/12/2017 0559   CO2 23 10/12/2017 0559   GLUCOSE 127 (H) 10/12/2017 0559   BUN 17 10/12/2017 0559   CREATININE 0.97 10/12/2017 0559   CALCIUM 8.9 10/12/2017 0559   GFRNONAA >60 10/12/2017 0559   GFRAA >60 10/12/2017 0559   Lipase  No results found for: LIPASE  Studies/Results: Dg Ribs Unilateral W/chest Right  Result Date: 10/11/2017 CLINICAL DATA:  Shortness of breath and right-sided rib pain following fall from roof, initial encounter EXAM: RIGHT RIBS AND CHEST - 3+ VIEW COMPARISON:  None. FINDINGS: Cardiac shadow is within normal limits. The overall inspiratory effort is poor with right basilar atelectasis. No pneumothorax is seen. Multiple right rib fractures are noted to include the sixth through ninth ribs posterolaterally. Some subcutaneous air is noted. IMPRESSION: Fractures of the right sixth through ninth ribs posterolaterally with associated basilar atelectasis. No definitive pneumothorax is seen. Electronically Signed   By: Alcide CleverMark  Lukens M.D.   On: 10/11/2017 21:43   Ct Chest W Contrast  Result Date: 10/11/2017 CLINICAL DATA:  57 y/o M; fall from 7 foot roof. Blunt trauma with injury to the right upper abdomen. EXAM: CT CHEST WITH CONTRAST TECHNIQUE: Multidetector CT imaging of the chest was performed during intravenous contrast administration. CONTRAST:  75mL OMNIPAQUE IOHEXOL 300 MG/ML  SOLN  COMPARISON:  None. FINDINGS: Cardiovascular: No significant vascular findings. Normal heart size. No pericardial effusion. Severe coronary artery calcific atherosclerosis. Mediastinum/Nodes: No enlarged mediastinal, hilar, or axillary lymph nodes. Thyroid gland, trachea, and esophagus demonstrate no significant findings. Lungs/Pleura: Edema within the right posterior chest wall as well as pneumatosis for multiple rib fractures. Small right pneumothorax. Upper Abdomen: Cholelithiasis. Musculoskeletal: Mildly displaced right lateral 6 rib fracture. Right rib 7 neck minimally  displaced fracture and fracture of the adjacent T7 right transverse process. Additional rib 7 right posterolateral 2 shaft's width displaced and comminuted fracture. Right rib 8 minimally displaced posterolateral fracture. Right rib 9 minimally displaced rib head and posterolateral fractures. IMPRESSION: 1. Small right pneumothorax.  No mediastinal shift. 2. Right ribs 6-9 posterior fractures. Ribs 7 and 9 have multiple fractures and there is 2 shaft's width displacement of the right posterolateral rib 7 fracture. 3. Right T7 transverse process minimally displaced fracture. 4. Edema and pneumatosis within the right posterior chest wall. 5. Cholelithiasis. 6. Severe coronary artery calcific atherosclerosis. These results were called by telephone at the time of interpretation on 10/11/2017 at 11:14 pm to Dr. Raeford Razor , who verbally acknowledged these results. Electronically Signed   By: Mitzi Hansen M.D.   On: 10/11/2017 23:21   Dg Chest Port 1 View  Result Date: 10/12/2017 CLINICAL DATA:  57 year old male with right rib fractures. EXAM: PORTABLE CHEST 1 VIEW COMPARISON:  Chest CT dated 10/11/2017 FINDINGS: Multiple displaced right rib fractures involving sixth, seventh, and eighth ribs as seen on the earlier CT. There is probable small pneumothorax along the right hemidiaphragm. No mediastinal shift or large pneumothorax. The left lung is clear. No pleural effusion. Top-normal cardiac size. Right chest wall and right supraclavicular soft tissue of the Siemens. IMPRESSION: Multiple right rib fractures with a small right pneumothorax. Electronically Signed   By: Elgie Collard M.D.   On: 10/12/2017 06:06      Jerre Simon , [facility] 10/12/2017, 8:08 AM  Pager: [phone] Mon-Wed, Friday 7:00am-4:30pm Thurs 7am-11:30am  Consults: [phone]

## 2017-10-12 NOTE — ED Notes (Signed)
Paged Trauma (blackmon) to Dr. Judd Lienelo

## 2017-10-13 NOTE — Discharge Summary (Signed)
[facility]/Trauma Discharge Summary   Patient ID: Connor Sanders MRN: [id number] DOB/AGE: 1960-06-20 57 y.o.  Admit date: 10/11/2017 Discharge date: 10/12/2017  Admitting Diagnosis: Fall from roof Right ribs 6-9 posterior fractures Small right pneumothorax T7 transverse process fracture  Discharge Diagnosis Patient Active Problem List   Diagnosis Date Noted  . Multiple rib fractures 10/12/2017    Consultants none  Imaging: Dg Ribs Unilateral W/chest Right  Result Date: 10/11/2017 CLINICAL DATA:  Shortness of breath and right-sided rib pain following fall from roof, initial encounter EXAM: RIGHT RIBS AND CHEST - 3+ VIEW COMPARISON:  None. FINDINGS: Cardiac shadow is within normal limits. The overall inspiratory effort is poor with right basilar atelectasis. No pneumothorax is seen. Multiple right rib fractures are noted to include the sixth through ninth ribs posterolaterally. Some subcutaneous air is noted. IMPRESSION: Fractures of the right sixth through ninth ribs posterolaterally with associated basilar atelectasis. No definitive pneumothorax is seen. Electronically Signed   By: Alcide Clever M.D.   On: 10/11/2017 21:43   Ct Chest W Contrast  Result Date: 10/11/2017 CLINICAL DATA:  57 y/o M; fall from 7 foot roof. Blunt trauma with injury to the right upper abdomen. EXAM: CT CHEST WITH CONTRAST TECHNIQUE: Multidetector CT imaging of the chest was performed during intravenous contrast administration. CONTRAST:  75mL OMNIPAQUE IOHEXOL 300 MG/ML  SOLN COMPARISON:  None. FINDINGS: Cardiovascular: No significant vascular findings. Normal heart size. No pericardial effusion. Severe coronary artery calcific atherosclerosis. Mediastinum/Nodes: No enlarged mediastinal, hilar, or axillary lymph nodes. Thyroid gland, trachea, and esophagus demonstrate no significant findings. Lungs/Pleura: Edema within the right posterior chest wall as well as pneumatosis for multiple rib  fractures. Small right pneumothorax. Upper Abdomen: Cholelithiasis. Musculoskeletal: Mildly displaced right lateral 6 rib fracture. Right rib 7 neck minimally displaced fracture and fracture of the adjacent T7 right transverse process. Additional rib 7 right posterolateral 2 shaft's width displaced and comminuted fracture. Right rib 8 minimally displaced posterolateral fracture. Right rib 9 minimally displaced rib head and posterolateral fractures. IMPRESSION: 1. Small right pneumothorax.  No mediastinal shift. 2. Right ribs 6-9 posterior fractures. Ribs 7 and 9 have multiple fractures and there is 2 shaft's width displacement of the right posterolateral rib 7 fracture. 3. Right T7 transverse process minimally displaced fracture. 4. Edema and pneumatosis within the right posterior chest wall. 5. Cholelithiasis. 6. Severe coronary artery calcific atherosclerosis. These results were called by telephone at the time of interpretation on 10/11/2017 at 11:14 pm to Dr. Raeford Razor , who verbally acknowledged these results. Electronically Signed   By: Mitzi Hansen M.D.   On: 10/11/2017 23:21   Dg Chest Port 1 View  Result Date: 10/12/2017 CLINICAL DATA:  57 year old male with right rib fractures. EXAM: PORTABLE CHEST 1 VIEW COMPARISON:  Chest CT dated 10/11/2017 FINDINGS: Multiple displaced right rib fractures involving sixth, seventh, and eighth ribs as seen on the earlier CT. There is probable small pneumothorax along the right hemidiaphragm. No mediastinal shift or large pneumothorax. The left lung is clear. No pleural effusion. Top-normal cardiac size. Right chest wall and right supraclavicular soft tissue of the Siemens. IMPRESSION: Multiple right rib fractures with a small right pneumothorax. Electronically Signed   By: Elgie Collard M.D.   On: 10/12/2017 06:06    Procedures none  HPI: Pertinent negatives include no abdominal pain or neck pain.  This gentleman arrived here this morning  transferred from [facility].  He presented there yesterday after falling 7 feet off a  roof.  He landed on his right side.  He was found on a work-up to have multiple right-sided rib fractures and a very small pneumothorax.  Currently, the patient reports moderate chest pain but no shortness of breath.  There was no loss of consciousness.  He currently denies headache, neck pain, or abdominal pain.  He is otherwise without complaints.  Hospital Course:  Workup showed Right ribs 6-9 posterior fractures, small right pneumothorax, T7 transverse process fracture. Patient was admitted to the trauma service and repeat chest xray the following day showed small persistent PTX. Patient worked with therapies and no follow up was recommended. On 08/28, the patient was voiding well, tolerating diet, ambulating well, pain well controlled, vital signs stable, and felt stable for discharge home.  Patient will follow up as outlined below and knows to call with questions or concerns.     Patient was discharged in good condition.  Physical Exam: Gen:  Alert, NAD, pleasant, cooperative Card:  RRR, no M/G/R heard Pulm:  CTA, no W/R/R, rate and effort normal Abd: Soft, NT/ND, +BS, no HSM Neuro: no motor or sensory deficits Extremities: moves all 4's Skin: no rashes noted, warm and dry  Allergies as of 10/12/2017   No Known Allergies     Medication List    TAKE these medications   acetaminophen 500 MG tablet Commonly known as:  TYLENOL Take 2 tablets (1,000 mg total) by mouth every 8 (eight) hours as needed for mild pain.   ibuprofen 800 MG tablet Commonly known as:  ADVIL,MOTRIN Take 0.5 tablets (400 mg total) by mouth every 8 (eight) hours as needed for mild pain or moderate pain.   polyethylene glycol packet Commonly known as:  MIRALAX / GLYCOLAX Take 17 g by mouth daily as needed for mild constipation.   traMADol 50 MG tablet Commonly known as:  ULTRAM Take 1 tablet (50 mg total) by mouth  every 6 (six) hours as needed for moderate pain.        Follow-up Information    [facility]. Schedule an appointment as soon as possible for a visit.   Why:  for follow up regarding rib fractures Contact information: [address] [place] [id number] [phone]          Signed: Joyce CopaJessica L [facility] 10/13/2017, 9:28 AM Pager: [phone] Consults: [phone] Mon-Fri 7:00 am-4:30 pm Sat-Sun 7:00 am-11:30 am

## 2020-03-21 IMAGING — CT CT CHEST W/ CM
2 of 4 series · 15 of 36 positions shown, 18 images · IV contrast (omnipaque)
Comparison: None.

CLINICAL DATA: 57 y/o M; fall from 7 foot roof. Blunt trauma with
injury to the right upper abdomen.

EXAM:
CT CHEST WITH CONTRAST
TECHNIQUE: Multidetector CT imaging of the chest was performed during
intravenous contrast administration.
CONTRAST:  75mL OMNIPAQUE IOHEXOL 300 MG/ML  SOLN

[Series 5: coronal · coronal · 0.61mm/px · 3 of 134 slices shown]
[im 27/134  lung]
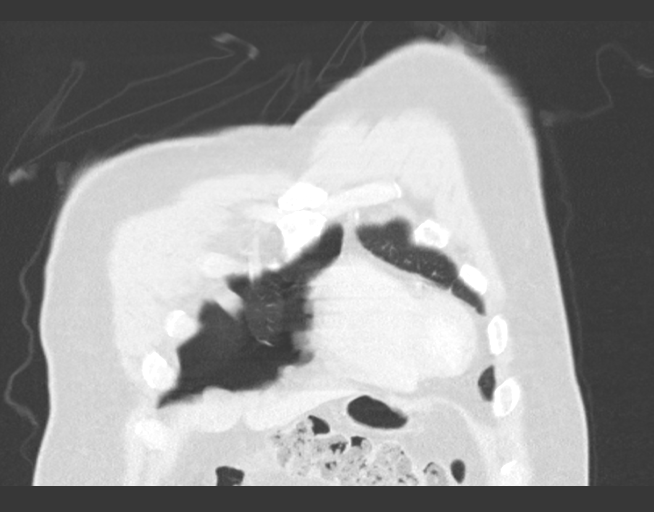
[im 54/134  lung]
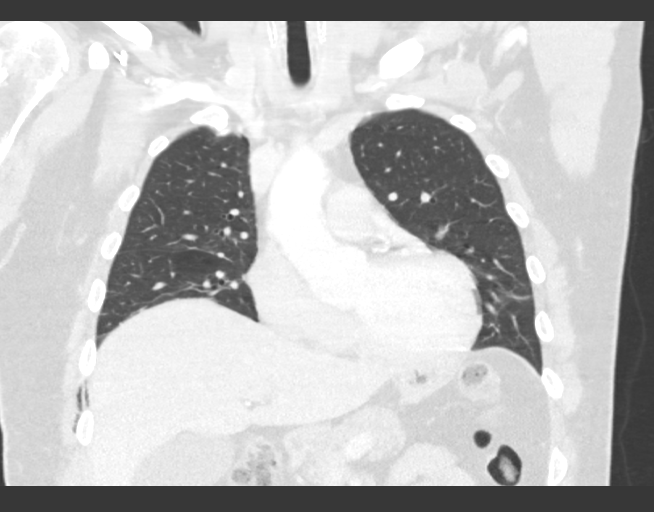
[im 80/134  lung]
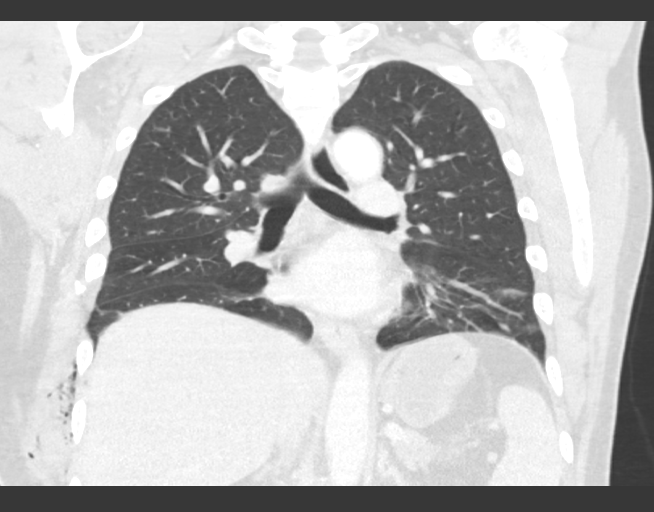

[Series 7: super d · axial · 0.82mm/px · z∈[+1188,+1441]mm · 12 of 354 slices shown, 15 images]
[im 19/354  mediastinal]
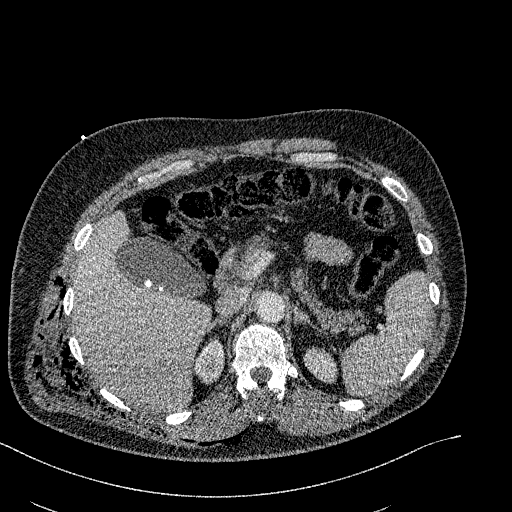
[im 19/354  lung]
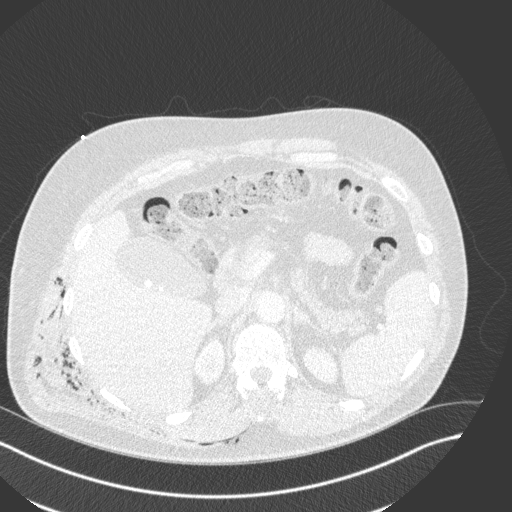
[im 56/354  lung]
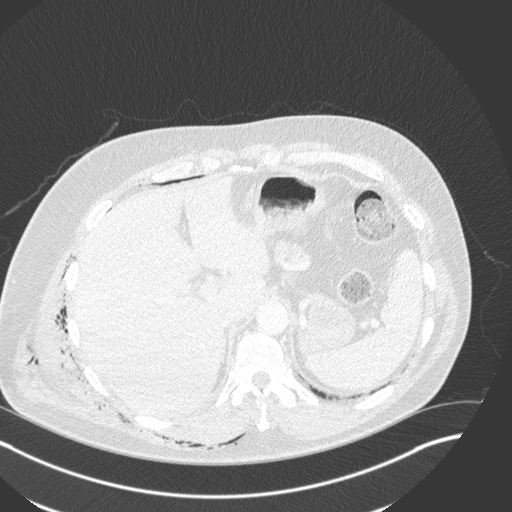
[im 75/354  lung]
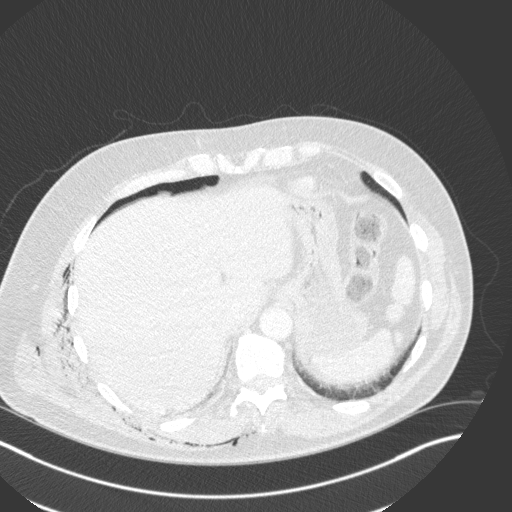
[im 112/354  lung]
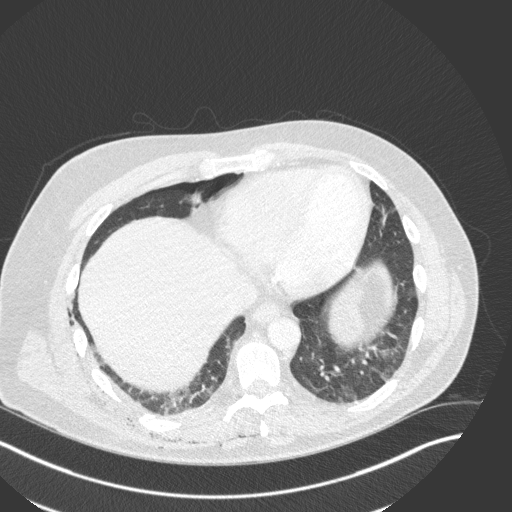
[im 131/354  mediastinal]
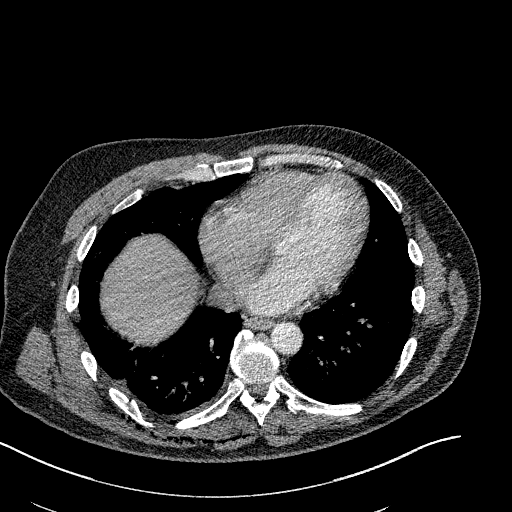
[im 131/354  lung]
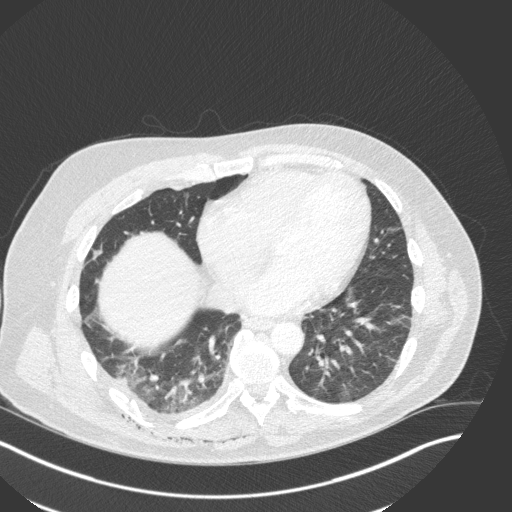
[im 168/354  lung]
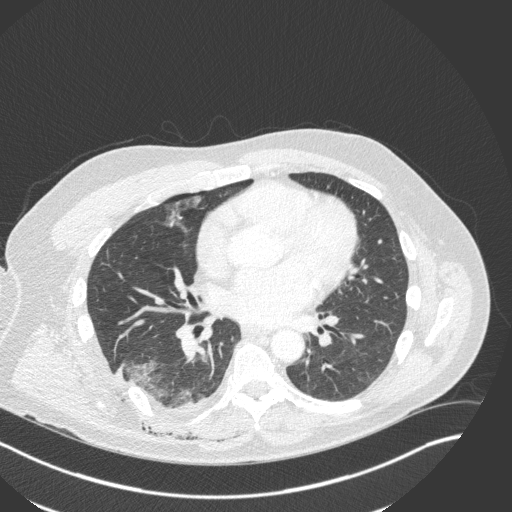
[im 186/354  lung]
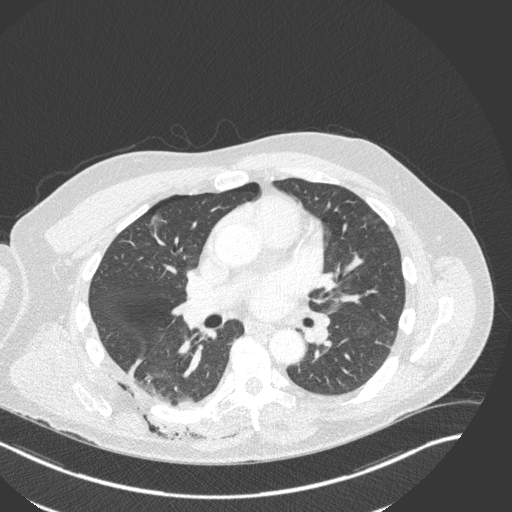
[im 223/354  lung]
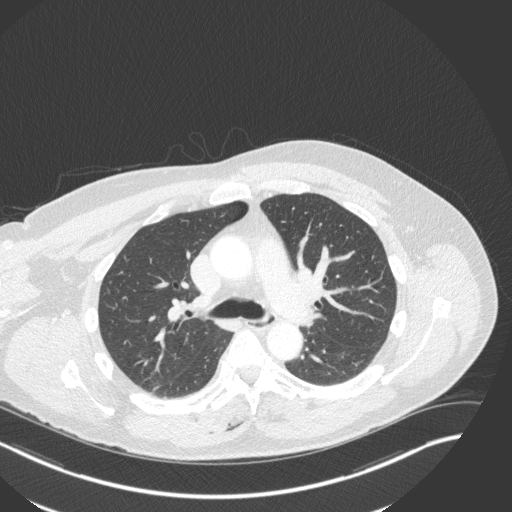
[im 242/354  mediastinal]
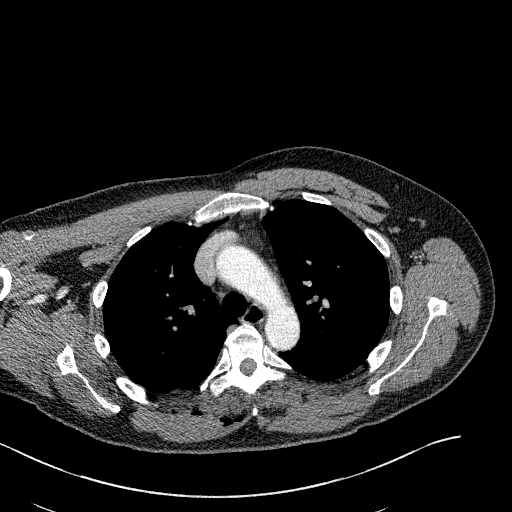
[im 242/354  lung]
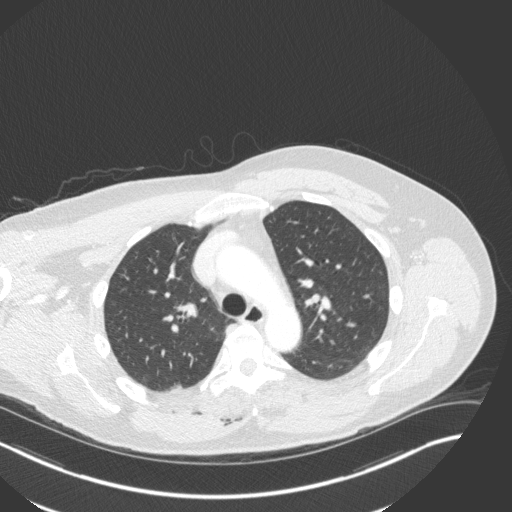
[im 279/354  lung]
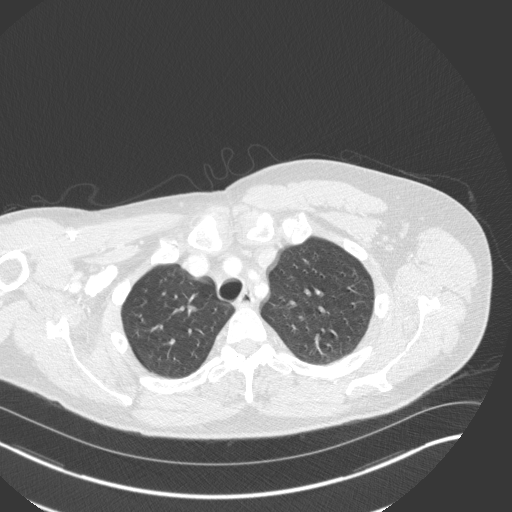
[im 298/354  lung]
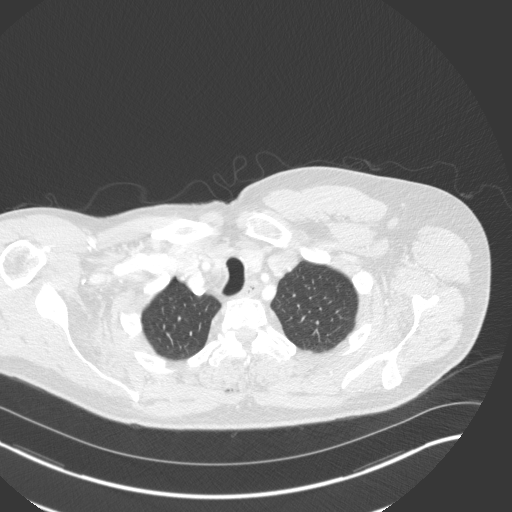
[im 335/354  lung]
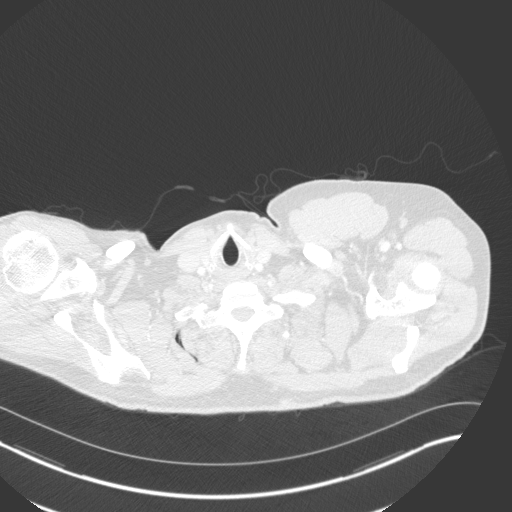

[15 of 36 positions shown; findings below may reference images not displayed]

FINDINGS: Cardiovascular: No significant vascular findings. Normal heart size.
No pericardial effusion. Severe coronary artery calcific
atherosclerosis.

Mediastinum/Nodes: No enlarged mediastinal, hilar, or axillary lymph
nodes. Thyroid gland, trachea, and esophagus demonstrate no
significant findings.

Lungs/Pleura: Edema within the right posterior chest wall as well as
pneumatosis for multiple rib fractures. Small right pneumothorax.

Upper Abdomen: Cholelithiasis.

Musculoskeletal: Mildly displaced right lateral 6 rib fracture.

Right rib 7 neck minimally displaced fracture and fracture of the
adjacent T7 right transverse process. Additional rib 7 right
posterolateral 2 shaft's width displaced and comminuted fracture.

Right rib 8 minimally displaced posterolateral fracture.

Right rib 9 minimally displaced rib head and posterolateral
fractures.
IMPRESSION: 1. Small right pneumothorax.  No mediastinal shift.
2. Right ribs 6-9 posterior fractures. Ribs 7 and 9 have multiple
fractures and there is 2 shaft's width displacement of the right
posterolateral rib 7 fracture.
3. Right T7 transverse process minimally displaced fracture.
4. Edema and pneumatosis within the right posterior chest wall.
5. Cholelithiasis.
6. Severe coronary artery calcific atherosclerosis.

These results were called by telephone at the time of interpretation
on 10/11/2017 at [DATE] to Dr. LAYLLY SOIMU UNGUREANU , who verbally
acknowledged these results.

By: Phumz Zwana M.D.

## 2020-03-21 IMAGING — DX DG RIBS W/ CHEST 3+V*R*
6 series · 6 of 6 positions shown · non-contrast
Comparison: None.

CLINICAL DATA: Shortness of breath and right-sided rib pain
following fall from roof, initial encounter

EXAM:
RIGHT RIBS AND CHEST - 3+ VIEW

[chest pa]
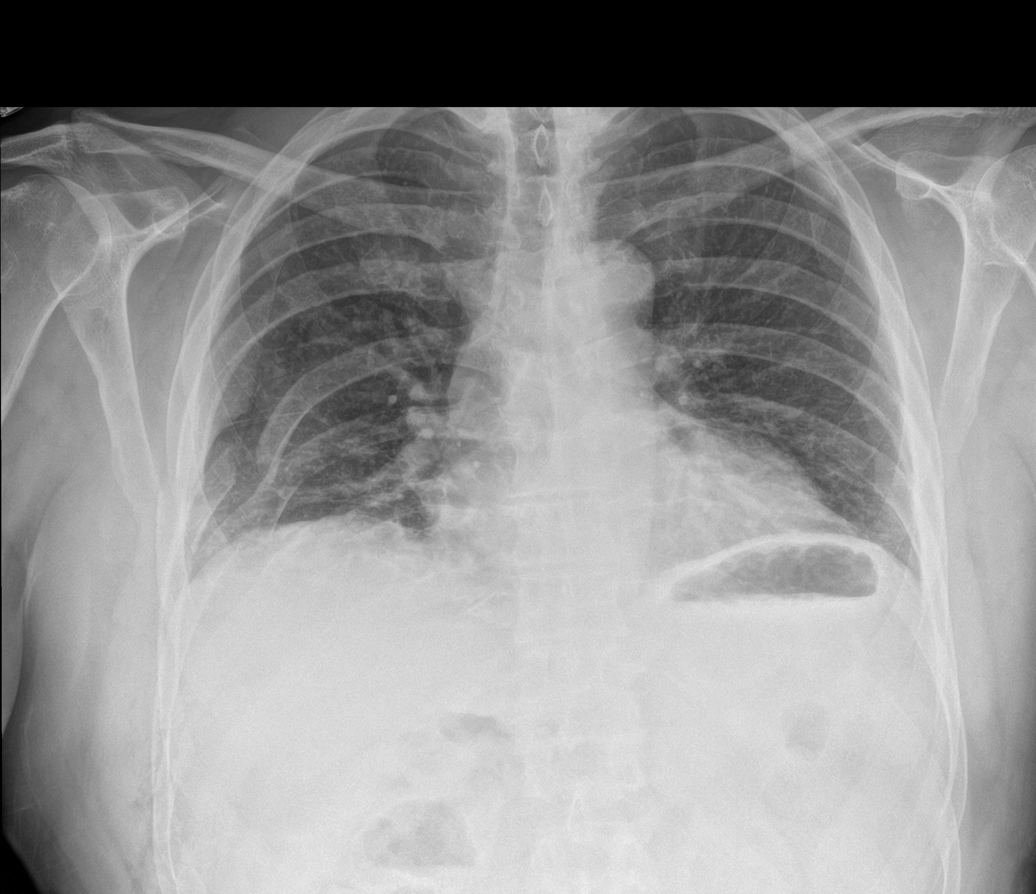

[rib pa (1 of 3)]
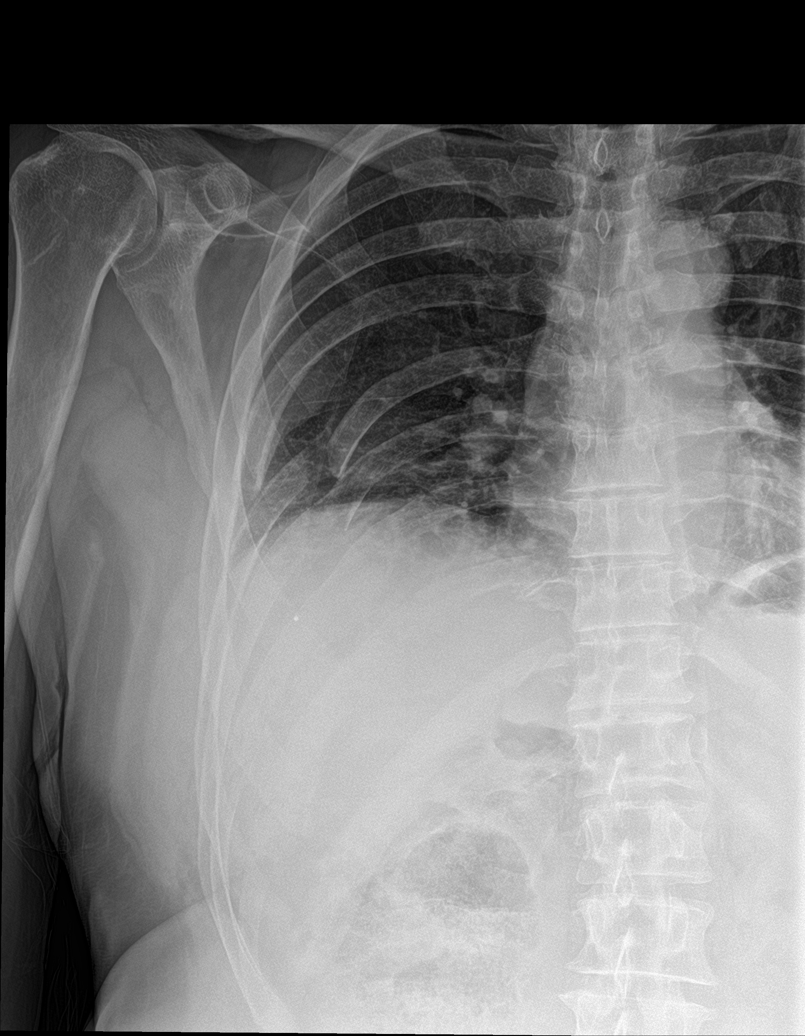

[rib pa obl (1 of 2)]
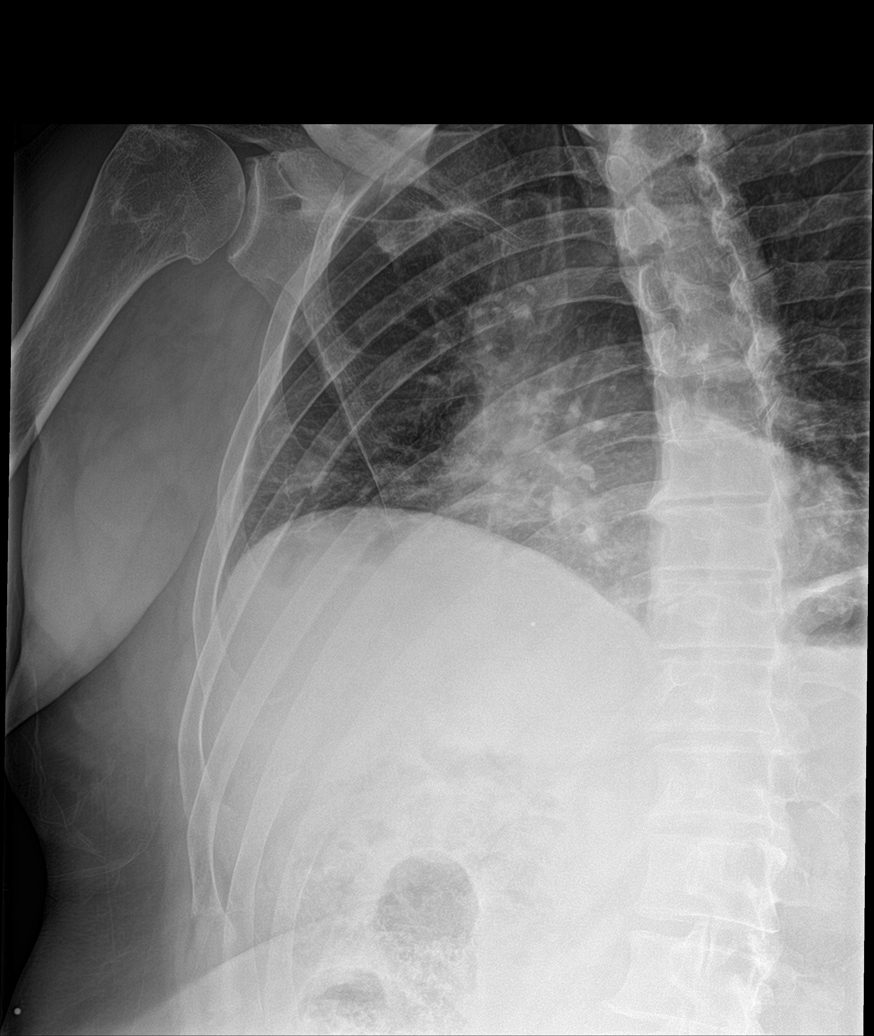

[rib pa obl (2 of 2)]
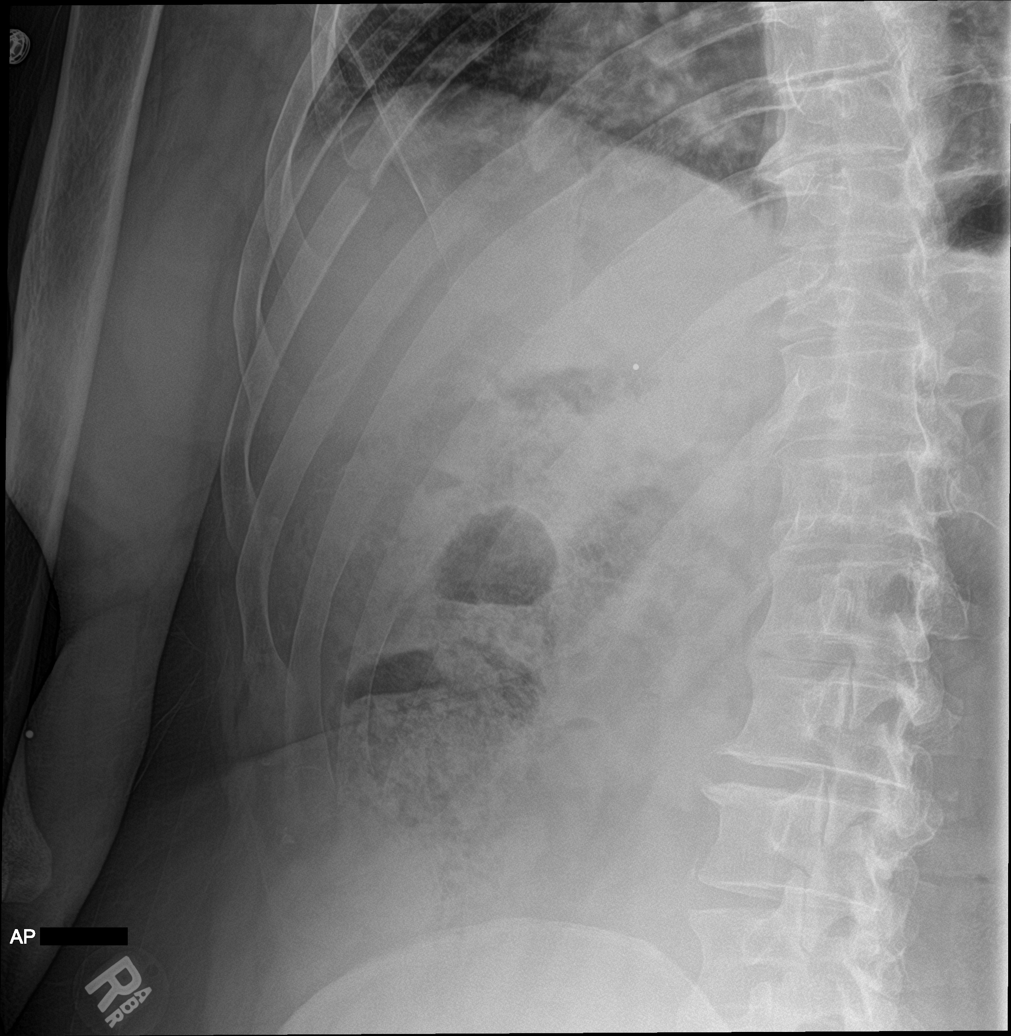

[rib pa (2 of 3)]
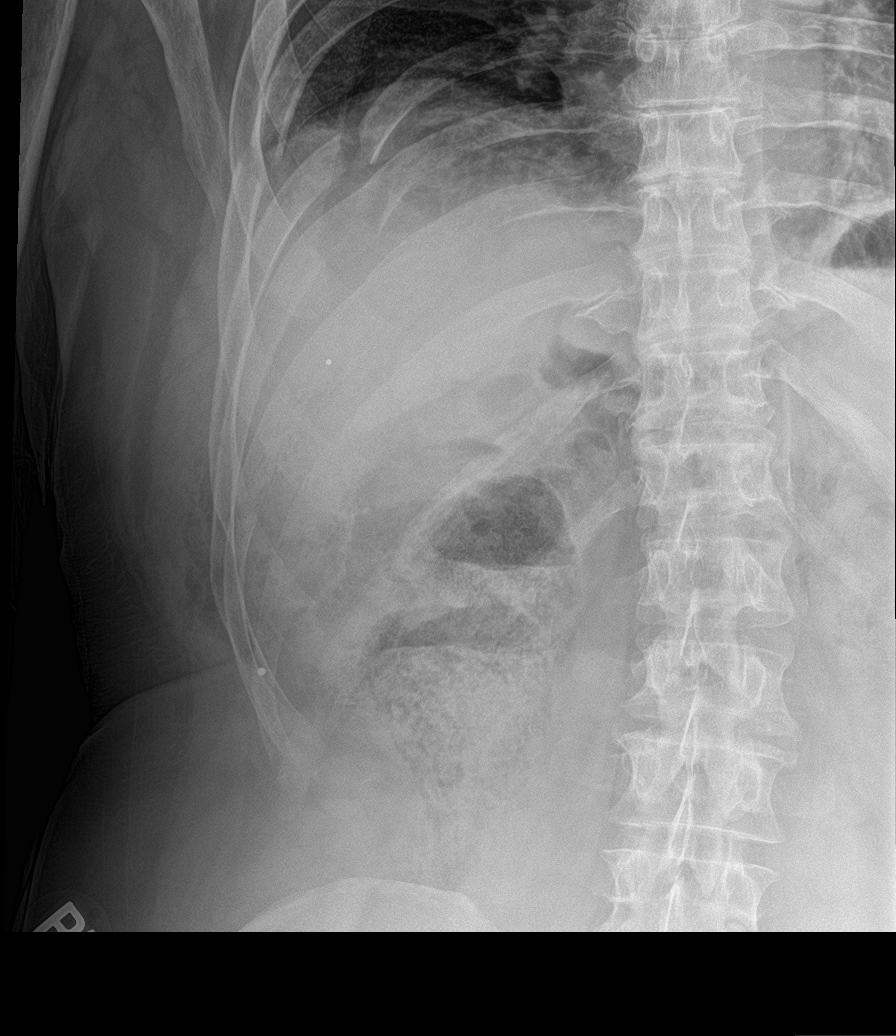

[rib pa (3 of 3)]
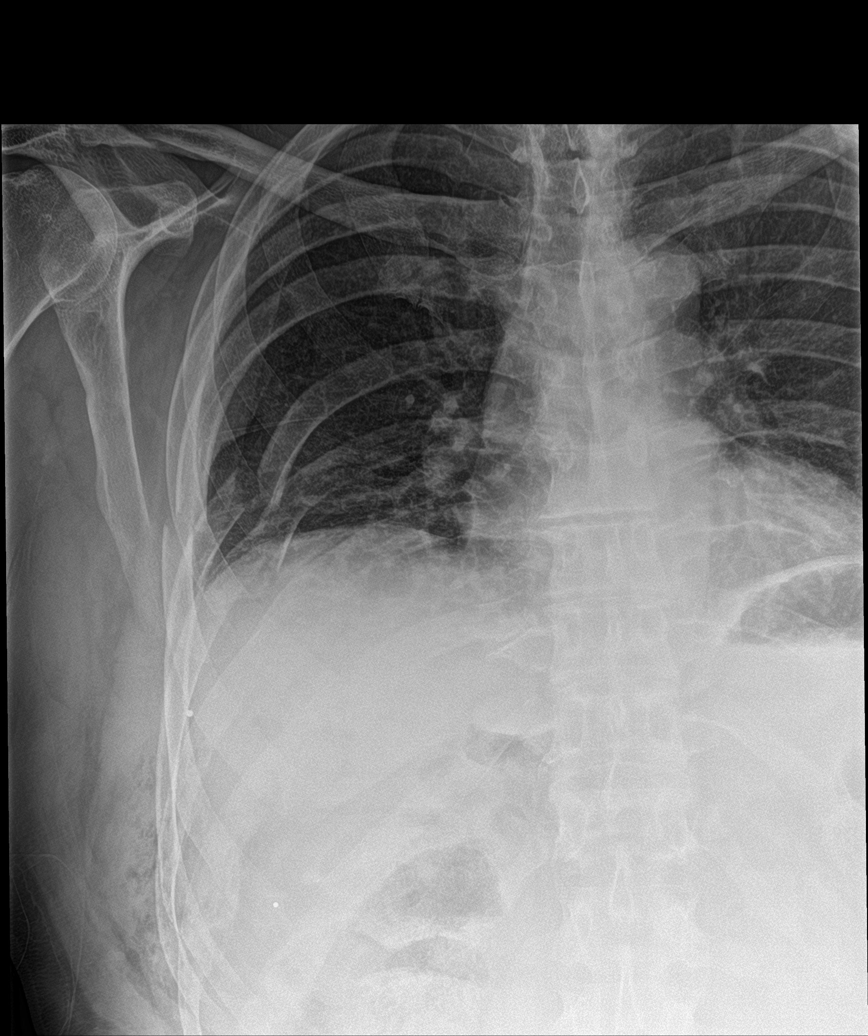

[6 of 6 positions shown; findings below may reference images not displayed]

FINDINGS: Cardiac shadow is within normal limits. The overall inspiratory
effort is poor with right basilar atelectasis. No pneumothorax is
seen. Multiple right rib fractures are noted to include the sixth
through ninth ribs posterolaterally. Some subcutaneous air is noted.
IMPRESSION: Fractures of the right sixth through ninth ribs posterolaterally
with associated basilar atelectasis. No definitive pneumothorax is
seen.
